# Patient Record
Sex: Male | Born: 1970 | Race: White | Hispanic: No | Marital: Married | Smoking: Current every day smoker
Health system: Southern US, Community
[De-identification: ages and names within clinical notes are randomized; demographics above are authoritative.]

---

## 2009-12-01 ENCOUNTER — Emergency Department: Payer: Self-pay | Admitting: Emergency Medicine

## 2012-07-15 ENCOUNTER — Emergency Department (HOSPITAL_COMMUNITY): Payer: 59

## 2012-07-15 ENCOUNTER — Encounter (HOSPITAL_COMMUNITY): Payer: Self-pay

## 2012-07-15 ENCOUNTER — Emergency Department (HOSPITAL_COMMUNITY)
Admission: EM | Admit: 2012-07-15 | Discharge: 2012-07-15 | Disposition: A | Discharge status: Home / Self Care | Payer: 59 | Attending: Emergency Medicine | Admitting: Emergency Medicine

## 2012-07-15 DIAGNOSIS — R0602 Shortness of breath: Secondary | ICD-10-CM

## 2012-07-15 DIAGNOSIS — R079 Chest pain, unspecified: Secondary | ICD-10-CM | POA: Insufficient documentation

## 2012-07-15 DIAGNOSIS — F419 Anxiety disorder, unspecified: Secondary | ICD-10-CM

## 2012-07-15 DIAGNOSIS — R110 Nausea: Secondary | ICD-10-CM | POA: Insufficient documentation

## 2012-07-15 DIAGNOSIS — R51 Headache: Secondary | ICD-10-CM | POA: Insufficient documentation

## 2012-07-15 DIAGNOSIS — F411 Generalized anxiety disorder: Secondary | ICD-10-CM | POA: Insufficient documentation

## 2012-07-15 LAB — CBC WITH DIFFERENTIAL/PLATELET
Basophils Absolute: 0 10*3/uL (ref 0.0–0.1)
Basophils Relative: 0 % (ref 0–1)
Eosinophils Relative: 1 % (ref 0–5)
HCT: 41.8 % (ref 39.0–52.0)
MCHC: 35.4 g/dL (ref 30.0–36.0)
MCV: 89.9 fL (ref 78.0–100.0)
Monocytes Absolute: 0.7 10*3/uL (ref 0.1–1.0)
Neutro Abs: 5.6 10*3/uL (ref 1.7–7.7)
RDW: 12.5 % (ref 11.5–15.5)

## 2012-07-15 LAB — BASIC METABOLIC PANEL
Calcium: 9.2 mg/dL (ref 8.4–10.5)
Creatinine, Ser: 0.68 mg/dL (ref 0.50–1.35)
GFR calc Af Amer: >90 mL/min (ref >90)
GFR calc non Af Amer: >90 mL/min (ref >90)

## 2012-07-15 LAB — TROPONIN I: Troponin I: <0.3 ng/mL (ref <0.30)

## 2012-07-15 MED ORDER — ASPIRIN 81 MG PO CHEW
CHEWABLE_TABLET | ORAL | Status: AC
  Administered 2012-07-15: 324 mg
  Filled 2012-07-15: qty 4

## 2012-07-15 MED ORDER — SODIUM CHLORIDE 0.9 % IV SOLN: INTRAVENOUS | Status: DC

## 2012-07-15 MED ORDER — NITROGLYCERIN 0.4 MG SL SUBL
0.4000 mg | SUBLINGUAL_TABLET | SUBLINGUAL | Status: DC | PRN
  Administered 2012-07-15 (×2): 0.4 mg via SUBLINGUAL
  Filled 2012-07-15: qty 25

## 2012-07-15 MED ORDER — ONDANSETRON HCL 4 MG/2ML IJ SOLN
4.0000 mg | Freq: Once | INTRAMUSCULAR | Status: AC
  Administered 2012-07-15: 4 mg via INTRAVENOUS
  Filled 2012-07-15: qty 2

## 2012-07-15 MED ORDER — LORAZEPAM 2 MG/ML IJ SOLN
1.0000 mg | Freq: Once | INTRAMUSCULAR | Status: AC
  Administered 2012-07-15: 09:00:00 via INTRAVENOUS
  Filled 2012-07-15: qty 1

## 2012-07-15 MED ORDER — SODIUM CHLORIDE 0.9 % IV BOLUS (SEPSIS)
250.0000 mL | Freq: Once | INTRAVENOUS | Status: AC
  Administered 2012-07-15: 999 mL via INTRAVENOUS

## 2012-07-15 MED ORDER — ASPIRIN 81 MG PO CHEW
324.0000 mg | CHEWABLE_TABLET | Freq: Once | ORAL | Status: AC
  Administered 2012-07-15: 324 mg via ORAL

## 2012-07-15 NOTE — ED Notes (Signed)
Pt rates pain/sensation 5/10 before ntg.

## 2012-07-15 NOTE — ED Notes (Signed)
Pt reports has worked a lot recently and felt bad over the weekend.  Says has been just aching all over.  Reports woke up this morning with "knot" in chest and difficulty breathing.  Denies any cold symptoms.

## 2012-07-15 NOTE — ED Notes (Signed)
Pt awaiting lab results. Denies any complaints at present.

## 2012-07-15 NOTE — ED Provider Notes (Signed)
History   This chart was scribed for Shelda Jakes, MD by Gerlean Ren. This patient was seen in room [facility]18/[facility]18 and the patient's care was started at 8:25AM.   CSN: [id number]  Arrival date & time 07/15/12  0746   First MD Initiated Contact with Patient 07/15/12 0804      Chief Complaint  Patient presents with  . Chest Pain    (Consider location/radiation/quality/duration/timing/severity/associated sxs/prior treatment) The history is provided by the patient. No language interpreter was used.   Alex Nolan is a 41 y.o. male who presents to the Emergency Department complaining of sudden onset, sharp substernal chest pain radiating to mid-back beginning 3.5 hours PTA with associated episodes of SOB.  Pt denies that deep breathing or movement worsens pain.  Pt reports myalgias and chronic abdominal pains, neither of which are currently worse than normal.  Pt reports sharp HA onset coinciding with chest pain.  Pt has not taken any OCM for pain.  Pt is a former smoker and reports occasional alcohol use.    History reviewed. No pertinent past medical history.  History reviewed. No pertinent past surgical history.  No family history on file.  History  Substance Use Topics  . Smoking status: Former Games developer  . Smokeless tobacco: Not on file  . Alcohol Use: Yes     occ      Review of Systems  Constitutional: Negative for fever and diaphoresis.  HENT: Negative for sore throat, rhinorrhea and neck pain.   Eyes: Negative for visual disturbance.  Respiratory: Positive for shortness of breath. Negative for cough.   Cardiovascular: Positive for chest pain.  Gastrointestinal: Positive for nausea. Negative for vomiting and diarrhea.  Genitourinary: Negative for dysuria.  Musculoskeletal: Positive for myalgias. Negative for back pain and joint swelling.  Skin: Negative for rash.  Neurological: Positive for headaches.    Allergies  Review of patient's allergies indicates no known  allergies.  Home Medications   Current Outpatient Rx  Name Route Sig Dispense Refill  . ADULT MULTIVITAMIN W/MINERALS CH Oral Take 1 tablet by mouth daily.    Marland Kitchen NAPROXEN SODIUM 220 MG PO TABS Oral Take 220 mg by mouth 2 (two) times daily as needed. Pain      BP 133/85  Pulse 99  Temp 97.7 F (36.5 C) (Oral)  Resp 20  Ht 6' (1.829 m)  Wt 225 lb (102.059 kg)  BMI 30.52 kg/m2  SpO2 99%  Physical Exam  Nursing note and vitals reviewed. Constitutional: He is oriented to person, place, and time. He appears well-developed and well-nourished.  HENT:  Head: Normocephalic and atraumatic.  Mouth/Throat: Oropharynx is clear and moist.  Eyes: Conjunctivae normal and EOM are normal. Pupils are equal, round, and reactive to light.  Neck: Normal range of motion. Neck supple. No tracheal deviation present.  Cardiovascular: Normal rate, regular rhythm and normal heart sounds.   No murmur heard. Pulmonary/Chest: Effort normal and breath sounds normal. He has no wheezes.  Abdominal: Soft. Bowel sounds are normal. There is no tenderness.  Musculoskeletal: Normal range of motion. He exhibits no edema.  Neurological: He is alert and oriented to person, place, and time. No cranial nerve deficit. Coordination normal.  Skin: Skin is warm and dry. He is not diaphoretic.  Psychiatric: He has a normal mood and affect.    ED Course  Procedures (including critical care time) DIAGNOSTIC STUDIES: Oxygen Saturation is 99% on room air, normal by my interpretation.    COORDINATION OF CARE: 8:37AM-  Ordered nitroglycerine and aspirin.  Informed pt he would need to stay for several hours of observation.   Labs Reviewed  BASIC METABOLIC PANEL - Abnormal; Notable for the following:    Potassium 3.2 (*)     Glucose, Bld 124 (*)     All other components within normal limits  CBC WITH DIFFERENTIAL  TROPONIN I  D-DIMER, QUANTITATIVE  TROPONIN I   Dg Chest Portable 1 View  07/15/2012  *RADIOLOGY REPORT*   Clinical Data: Chest pain, history of smoking  PORTABLE CHEST - 1 VIEW  Comparison: None.  Findings: No active infiltrate or effusion is seen.  Probable mild linear atelectasis or scarring is noted at the right lung base. The heart is within normal limits in size.  No bony abnormality is seen.  IMPRESSION: No active lung disease.  Mild right basilar linear atelectasis or scarring.   Original Report Authenticated By: Juline Patch, M.D.     Date: 07/15/2012  Rate: 98  Rhythm: normal sinus rhythm and sinus arrhythmia  QRS Axis: normal  Intervals: normal  ST/T Wave abnormalities: normal  Conduction Disutrbances:none  Narrative Interpretation:   Old EKG Reviewed: none available  Results for orders placed during the hospital encounter of 07/15/12  CBC WITH DIFFERENTIAL      Component Value Range   WBC 8.9  4.0 - 10.5 K/uL   RBC 4.65  4.22 - 5.81 MIL/uL   Hemoglobin 14.8  13.0 - 17.0 g/dL   HCT 09.8  11.9 - 14.7 %   MCV 89.9  78.0 - 100.0 fL   MCH 31.8  26.0 - 34.0 pg   MCHC 35.4  30.0 - 36.0 g/dL   RDW 82.9  56.2 - 13.0 %   Platelets 300  150 - 400 K/uL   Neutrophils Relative 63  43 - 77 %   Neutro Abs 5.6  1.7 - 7.7 K/uL   Lymphocytes Relative 28  12 - 46 %   Lymphs Abs 2.5  0.7 - 4.0 K/uL   Monocytes Relative 8  3 - 12 %   Monocytes Absolute 0.7  0.1 - 1.0 K/uL   Eosinophils Relative 1  0 - 5 %   Eosinophils Absolute 0.1  0.0 - 0.7 K/uL   Basophils Relative 0  0 - 1 %   Basophils Absolute 0.0  0.0 - 0.1 K/uL  BASIC METABOLIC PANEL      Component Value Range   Sodium 140  135 - 145 mEq/L   Potassium 3.2 (*) 3.5 - 5.1 mEq/L   Chloride 105  96 - 112 mEq/L   CO2 24  19 - 32 mEq/L   Glucose, Bld 124 (*) 70 - 99 mg/dL   BUN 7  6 - 23 mg/dL   Creatinine, Ser 8.65  0.50 - 1.35 mg/dL   Calcium 9.2  8.4 - 78.4 mg/dL   GFR calc non Af Amer >90  >90 mL/min   GFR calc Af Amer >90  >90 mL/min  TROPONIN I      Component Value Range   Troponin I <0.30  <0.30 ng/mL  D-DIMER, QUANTITATIVE       Component Value Range   D-Dimer, Quant 0.28  0.00 - 0.48 ug/mL-FEU  TROPONIN I      Component Value Range   Troponin I <0.30  <0.30 ng/mL      1. Chest pain   2. Shortness of breath   3. Anxiety       MDM  Workup for  chest pain shortness of breath without any significant findings. Troponin markers x2 -1 including 6 hours after the pain. Pain relief here in the emergency department with aspirin and nitroglycerin. Has not reoccurred. D-dimer was negative knots consistent with pulmonary and wasn't chest x-rays negative. Patient will be referred to cardiology followup and we recommended to start taking a baby aspirin a day and return for any newer worse symptoms.   I personally performed the services described in this documentation, which was scribed in my presence. The recorded information has been reviewed and considered.           Shelda Jakes, MD 07/15/12 [phone]

## 2012-07-15 NOTE — ED Notes (Signed)
Pt reports that pain is now completely gone after 2 ntg. md notified.

## 2012-07-15 NOTE — ED Notes (Signed)
Pt stated he has not feel well all weekend, today was up getting ready for work and felt worse than ever. midsternal cp, sob at times, denies any n/v/d. Denies any cough or fever. Former smoker.  Pt placed on all monitors.

## 2012-07-15 NOTE — ED Notes (Signed)
Pt cont. To be pain free, dozing in bed, resp even and unlabored, rise and fall of chest noted.

## 2012-08-02 ENCOUNTER — Encounter (HOSPITAL_COMMUNITY): Payer: Self-pay | Admitting: *Deleted

## 2012-08-02 ENCOUNTER — Emergency Department (HOSPITAL_COMMUNITY)
Admission: EM | Admit: 2012-08-02 | Discharge: 2012-08-02 | Disposition: A | Discharge status: Home / Self Care | Payer: 59 | Attending: Emergency Medicine | Admitting: Emergency Medicine

## 2012-08-02 DIAGNOSIS — J029 Acute pharyngitis, unspecified: Secondary | ICD-10-CM

## 2012-08-02 DIAGNOSIS — Z87891 Personal history of nicotine dependence: Secondary | ICD-10-CM | POA: Insufficient documentation

## 2012-08-02 MED ORDER — PENICILLIN G BENZATHINE 1200000 UNIT/2ML IM SUSP
1.2000 10*6.[IU] | Freq: Once | INTRAMUSCULAR | Status: AC
  Administered 2012-08-02: 1.2 10*6.[IU] via INTRAMUSCULAR
  Filled 2012-08-02 (×2): qty 2

## 2012-08-02 MED ORDER — IBUPROFEN 800 MG PO TABS
800.0000 mg | ORAL_TABLET | Freq: Once | ORAL | Status: AC
  Administered 2012-08-02: 800 mg via ORAL
  Filled 2012-08-02: qty 1

## 2012-08-02 NOTE — ED Notes (Signed)
Pt c/o sore throat and states he has hx of strep throat. Pt states this feels same as past strep.

## 2012-08-02 NOTE — ED Notes (Signed)
Pt discharged. Pt stable at time of discharge. Medications reviewed pt has no questions regarding discharge at this time. Pt voiced understanding of discharge instructions.  

## 2012-08-02 NOTE — ED Provider Notes (Signed)
History     CSN: [id number]  Arrival date & time 08/02/12  2259   First MD Initiated Contact with Patient 08/02/12 2319      Chief Complaint  Patient presents with  . Sore Throat    (Consider location/radiation/quality/duration/timing/severity/associated sxs/prior treatment) HPI Comments: T max 102.  No PCP.  Patient is a 41 y.o. male presenting with pharyngitis. The history is provided by the patient. No language interpreter was used.  Sore Throat This is a new problem. Episode onset: 5 days ago. The problem occurs constantly. The problem has been unchanged. Associated symptoms include a fever, headaches, myalgias, a sore throat and swollen glands. Pertinent negatives include no chills, coughing, nausea, rash or vomiting. The symptoms are aggravated by swallowing. Treatments tried: salt water gargles. The treatment provided mild relief.    History reviewed. No pertinent past medical history.  History reviewed. No pertinent past surgical history.  History reviewed. No pertinent family history.  History  Substance Use Topics  . Smoking status: Former Games developer  . Smokeless tobacco: Not on file  . Alcohol Use: Yes     occ      Review of Systems  Constitutional: Positive for fever. Negative for chills.  HENT: Positive for sore throat. Negative for trouble swallowing.   Respiratory: Negative for cough and wheezing.   Gastrointestinal: Negative for nausea and vomiting.  Musculoskeletal: Positive for myalgias.  Skin: Negative for rash.  Neurological: Positive for headaches.  All other systems reviewed and are negative.    Allergies  Review of patient's allergies indicates no known allergies.  Home Medications   Current Outpatient Rx  Name Route Sig Dispense Refill  . ADULT MULTIVITAMIN W/MINERALS CH Oral Take 1 tablet by mouth daily.    Marland Kitchen NAPROXEN SODIUM 220 MG PO TABS Oral Take 220 mg by mouth 2 (two) times daily as needed. Pain      BP 112/83  Pulse 95  Temp  98.1 F (36.7 C) (Oral)  Resp 18  Ht 6' (1.829 m)  Wt 230 lb (104.327 kg)  BMI 31.19 kg/m2  SpO2 96%  Physical Exam  Nursing note and vitals reviewed. Constitutional: He is oriented to person, place, and time. He appears well-developed and well-nourished. No distress.  HENT:  Head: Normocephalic and atraumatic. No trismus in the jaw.  Right Ear: External ear normal.  Left Ear: External ear normal.  Mouth/Throat: Uvula is midline. No dental abscesses or uvula swelling. Oropharyngeal exudate and posterior oropharyngeal erythema present. No posterior oropharyngeal edema.  Eyes: EOM are normal.  Neck: Normal range of motion.  Cardiovascular: Normal rate, regular rhythm, normal heart sounds and intact distal pulses.   Pulmonary/Chest: Breath sounds normal. Accessory muscle usage present. No stridor. Not tachypneic. No respiratory distress. He has no decreased breath sounds. He has no wheezes. He has no rhonchi. He has no rales. He exhibits no tenderness.  Abdominal: Soft. He exhibits no distension. There is no tenderness.  Musculoskeletal: Normal range of motion.  Lymphadenopathy:    He has cervical adenopathy.       Right cervical: Superficial cervical and deep cervical adenopathy present.       Left cervical: Superficial cervical and deep cervical adenopathy present.  Neurological: He is alert and oriented to person, place, and time.  Skin: Skin is warm and dry. He is not diaphoretic.  Psychiatric: He has a normal mood and affect. Judgment normal.    ED Course  Procedures (including critical care time)   Labs Reviewed  RAPID  STREP SCREEN   No results found.   1. Pharyngitis       MDM  Treated empirically for strep.   Continue salt water gargles Ibuprofen 800 mg TID      sor  Evalina Field, [place] 08/02/12 2337

## 2012-08-03 NOTE — ED Provider Notes (Signed)
Medical screening examination/treatment/procedure(s) were performed by non-physician practitioner and as supervising physician I was immediately available for consultation/collaboration.  Donnetta Hutching, MD 08/03/12 0030

## 2013-11-09 ENCOUNTER — Emergency Department (HOSPITAL_COMMUNITY)
Admission: EM | Admit: 2013-11-09 | Discharge: 2013-11-09 | Disposition: A | Discharge status: Home / Self Care | Payer: 59 | Attending: Emergency Medicine | Admitting: Emergency Medicine

## 2013-11-09 ENCOUNTER — Emergency Department (HOSPITAL_COMMUNITY): Payer: 59

## 2013-11-09 ENCOUNTER — Encounter (HOSPITAL_COMMUNITY): Payer: Self-pay | Admitting: Emergency Medicine

## 2013-11-09 DIAGNOSIS — S62319A Displaced fracture of base of unspecified metacarpal bone, initial encounter for closed fracture: Secondary | ICD-10-CM | POA: Insufficient documentation

## 2013-11-09 DIAGNOSIS — Y939 Activity, unspecified: Secondary | ICD-10-CM | POA: Insufficient documentation

## 2013-11-09 DIAGNOSIS — S62308A Unspecified fracture of other metacarpal bone, initial encounter for closed fracture: Secondary | ICD-10-CM

## 2013-11-09 DIAGNOSIS — Y929 Unspecified place or not applicable: Secondary | ICD-10-CM | POA: Insufficient documentation

## 2013-11-09 DIAGNOSIS — Z87891 Personal history of nicotine dependence: Secondary | ICD-10-CM | POA: Insufficient documentation

## 2013-11-09 DIAGNOSIS — W010XXA Fall on same level from slipping, tripping and stumbling without subsequent striking against object, initial encounter: Secondary | ICD-10-CM | POA: Insufficient documentation

## 2013-11-09 MED ORDER — HYDROCODONE-ACETAMINOPHEN 5-325 MG PO TABS: 1.0000 | ORAL_TABLET | ORAL | Status: AC | PRN

## 2013-11-09 MED ORDER — HYDROCODONE-ACETAMINOPHEN 5-325 MG PO TABS
2.0000 | ORAL_TABLET | Freq: Once | ORAL | Status: AC
  Administered 2013-11-09: 2 via ORAL
  Filled 2013-11-09: qty 2

## 2013-11-09 NOTE — ED Provider Notes (Signed)
CSN: [id number]     Arrival date & time 11/09/13  1807 History   First MD Initiated Contact with Patient 11/09/13 1833     Chief Complaint  Patient presents with  . Hand Pain   (Consider location/radiation/quality/duration/timing/severity/associated sxs/prior Treatment) HPI  Patient presents to the ED for evaluation of his right hand pain. He tripped on liquid and slipped falling on his right hand. The incident happened around lunch time and it hurts severely and he does not want to move it. He reports it being swollen and bruised. Denies head injury or loc.  History reviewed. No pertinent past medical history. History reviewed. No pertinent past surgical history. No family history on file. History  Substance Use Topics  . Smoking status: Former Games developer  . Smokeless tobacco: Not on file  . Alcohol Use: Yes     Comment: occ    Review of Systems ROS  The patient denies anorexia, fever, weight loss,, vision loss, decreased hearing, hoarseness, chest pain, syncope, dyspnea on exertion, peripheral edema, balance deficits, hemoptysis, abdominal pain, melena, hematochezia, severe indigestion/heartburn, hematuria, incontinence, genital sores, muscle weakness, suspicious skin lesions, transient blindness, difficulty walking, depression, unusual weight change, abnormal bleeding, enlarged lymph nodes, angioedema, and breast masses.  Allergies  Review of patient's allergies indicates no known allergies.  Home Medications   Current Outpatient Rx  Name  Route  Sig  Dispense  Refill  . HYDROcodone-acetaminophen (NORCO/VICODIN) 5-325 MG per tablet   Oral   Take 1-2 tablets by mouth every 4 (four) hours as needed.   20 tablet   0   . Multiple Vitamin (MULTIVITAMIN WITH MINERALS) TABS   Oral   Take 1 tablet by mouth daily.         . naproxen sodium (ALEVE) 220 MG tablet   Oral   Take 220 mg by mouth 2 (two) times daily as needed. Pain          BP 131/92  Pulse 102  Temp(Src) 98.1  F (36.7 C) (Oral)  Resp 20  Ht 6' (1.829 m)  Wt 218 lb (98.884 kg)  BMI 29.56 kg/m2  SpO2 96% Physical Exam  Nursing note and vitals reviewed. Constitutional: He appears well-developed and well-nourished. No distress.  HENT:  Head: Normocephalic and atraumatic.  Eyes: Pupils are equal, round, and reactive to light.  Neck: Normal range of motion. Neck supple.  Cardiovascular: Normal rate and regular rhythm.   Pulmonary/Chest: Effort normal.  Abdominal: Soft.  Musculoskeletal:       Right hand: He exhibits decreased range of motion, tenderness, bony tenderness and swelling. He exhibits normal two-point discrimination, normal capillary refill, no deformity and no laceration. Normal sensation noted. Decreased strength noted.       Hands: Neurological: He is alert.  Skin: Skin is warm and dry.    ED Course  Procedures (including critical care time) Labs Review Labs Reviewed - No data to display Imaging Review Dg Wrist Complete Right  11/09/2013   CLINICAL DATA:  Larey Seat on a wet floor and injured the right hand and wrist.  EXAM: RIGHT WRIST - COMPLETE 3+ VIEW  COMPARISON:  None.  FINDINGS: No evidence of acute fracture or dislocation. Joint spaces well preserved. Well-preserved bone mineral density. No intrinsic osseous abnormalities.  IMPRESSION: Normal examination.   Electronically Signed   By: Hulan Saas M.D.   On: 11/09/2013 18:44   Dg Hand Complete Right  11/09/2013   CLINICAL DATA:  Larey Seat on a wet floor and injured the right  hand and wrist.  EXAM: RIGHT HAND - COMPLETE 3+ VIEW  COMPARISON:  None.  FINDINGS: Nondisplaced fracture involving the base of the 5th metacarpal. No other fractures involving the bones of the hand. Well preserved bone mineral density. Well preserved joint spaces.  IMPRESSION: Nondisplaced fracture involving the base of the 5th metacarpal.   Electronically Signed   By: Hulan Saashomas  Lawrence M.D.   On: 11/09/2013 18:46    EKG Interpretation   None        MDM   1. Closed fracture of 5th metacarpal, initial encounter    Discussed case with Dr. Hilda LiasKeeling. Will place patient in ulnar gutter splint as requested and have patient call physicians office int he morning to schedule f/u appointment.  Analgesic pain medication given RICE criteria advised, NSAIDs advised.  43 y.o.Alex Nolan evaluation in the Emergency Department is complete. It has been determined that no acute conditions requiring further emergency intervention are present at this time. The patient/guardian have been advised of the diagnosis and plan. We have discussed signs and symptoms that warrant return to the ED, such as changes or worsening in symptoms.  Vital signs are stable at discharge. Filed Vitals:   11/09/13 1816  BP: 131/92  Pulse: 102  Temp: 98.1 F (36.7 C)  Resp: 20    Patient/guardian has voiced understanding and agreed to follow-up with the PCP or specialist.    Dorthula Matasiffany G Ranald Alessio, PA-C 11/09/13 1928

## 2013-11-09 NOTE — ED Notes (Signed)
Pt c/o right hand pain that started after falling and hitting his hand on the floor, pt states that he slipped on something in the floor,

## 2013-11-09 NOTE — Discharge Instructions (Signed)
Cast or Splint Care Casts and splints support injured limbs and keep bones from moving while they heal. It is important to care for your cast or splint at home.  HOME CARE INSTRUCTIONS  Keep the cast or splint uncovered during the drying period. It can take 24 to 48 hours to dry if it is made of plaster. A fiberglass cast will dry in less than 1 hour.  Do not rest the cast on anything harder than a pillow for the first 24 hours.  Do not put weight on your injured limb or apply pressure to the cast until your health care provider gives you permission.  Keep the cast or splint dry. Wet casts or splints can lose their shape and may not support the limb as well. A wet cast that has lost its shape can also create harmful pressure on your skin when it dries. Also, wet skin can become infected.  Cover the cast or splint with a plastic bag when bathing or when out in the rain or snow. If the cast is on the trunk of the body, take sponge baths until the cast is removed.  If your cast does become wet, dry it with a towel or a blow dryer on the cool setting only.  Keep your cast or splint clean. Soiled casts may be wiped with a moistened cloth.  Do not place any hard or soft foreign objects under your cast or splint, such as cotton, toilet paper, lotion, or powder.  Do not try to scratch the skin under the cast with any object. The object could get stuck inside the cast. Also, scratching could lead to an infection. If itching is a problem, use a blow dryer on a cool setting to relieve discomfort.  Do not trim or cut your cast or remove padding from inside of it.  Exercise all joints next to the injury that are not immobilized by the cast or splint. For example, if you have a long leg cast, exercise the hip joint and toes. If you have an arm cast or splint, exercise the shoulder, elbow, thumb, and fingers.  Elevate your injured arm or leg on 1 or 2 pillows for the first 1 to 3 days to decrease  swelling and pain.It is best if you can comfortably elevate your cast so it is higher than your heart. SEEK MEDICAL CARE IF:   Your cast or splint cracks.  Your cast or splint is too tight or too loose.  You have unbearable itching inside the cast.  Your cast becomes wet or develops a soft spot or area.  You have a bad smell coming from inside your cast.  You get an object stuck under your cast.  Your skin around the cast becomes red or raw.  You have new pain or worsening pain after the cast has been applied. SEEK IMMEDIATE MEDICAL CARE IF:   You have fluid leaking through the cast.  You are unable to move your fingers or toes.  You have discolored (blue or white), cool, painful, or very swollen fingers or toes beyond the cast.  You have tingling or numbness around the injured area.  You have severe pain or pressure under the cast.  You have any difficulty with your breathing or have shortness of breath.  You have chest pain. Document Released: 10/13/2000 Document Revised: 08/06/2013 Document Reviewed: 04/24/2013 [facility] Patient Information 2014 [place], [place].  Boxer's Fracture You have a break (fracture) of the fifth metacarpal bone. This is  commonly called a boxer's fracture. This is the bone in the hand where the little finger attaches. The fracture is in the end of that bone, closest to the little finger. It is usually caused when you hit an object with a clenched fist. Often, the knuckle is pushed down by the impact. Sometimes, the fracture rotates out of position. A boxer's fracture will usually heal within 6 weeks, if it is treated properly and protected from re-injury. Surgery is sometimes needed. A cast, splint, or bulky hand dressing may be used to protect and immobilize a boxer's fracture. Do not remove this device or dressing until your caregiver approves. Keep your hand elevated, and apply ice packs for 15-20 minutes every 2 hours, for the first 2 days.  Elevation and ice help reduce swelling and relieve pain. See your caregiver, or an orthopedic specialist, for follow-up care within the next 10 days. This is to make sure your fracture is healing properly. Document Released: 10/16/2005 Document Revised: 01/08/2012 Document Reviewed: 04/05/2007 [facility]Exit[facility] Patient Information 2014 [place], [place].

## 2013-11-10 NOTE — ED Provider Notes (Signed)
Medical screening examination/treatment/procedure(s) were performed by non-physician practitioner and as supervising physician I was immediately available for consultation/collaboration.  EKG Interpretation   None         Deovion Batrez M Marjani Kobel, DO 11/10/13 1157 

## 2014-01-01 ENCOUNTER — Emergency Department: Payer: Self-pay | Admitting: Emergency Medicine

## 2014-01-01 LAB — DRUG SCREEN, URINE
Amphetamines, Ur Screen: NEGATIVE (ref ?–1000)
Barbiturates, Ur Screen: NEGATIVE (ref ?–200)
Benzodiazepine, Ur Scrn: POSITIVE (ref ?–200)
CANNABINOID 50 NG, UR ~~LOC~~: NEGATIVE (ref ?–50)
Cocaine Metabolite,Ur ~~LOC~~: NEGATIVE (ref ?–300)
MDMA (ECSTASY) UR SCREEN: NEGATIVE (ref ?–500)
METHADONE, UR SCREEN: NEGATIVE (ref ?–300)
Opiate, Ur Screen: POSITIVE (ref ?–300)
PHENCYCLIDINE (PCP) UR S: NEGATIVE (ref ?–25)
Tricyclic, Ur Screen: NEGATIVE (ref ?–1000)

## 2014-01-01 LAB — COMPREHENSIVE METABOLIC PANEL
ALK PHOS: 92 U/L
Albumin: 3.7 g/dL (ref 3.4–5.0)
Anion Gap: 6 — ABNORMAL LOW (ref 7–16)
BILIRUBIN TOTAL: 0.5 mg/dL (ref 0.2–1.0)
BUN: 9 mg/dL (ref 7–18)
CHLORIDE: 107 mmol/L (ref 98–107)
CO2: 26 mmol/L (ref 21–32)
Calcium, Total: 8.9 mg/dL (ref 8.5–10.1)
Creatinine: 0.74 mg/dL (ref 0.60–1.30)
EGFR (African American): >60
EGFR (Non-African Amer.): >60
Glucose: 113 mg/dL — ABNORMAL HIGH (ref 65–99)
OSMOLALITY: 277 (ref 275–301)
POTASSIUM: 3.6 mmol/L (ref 3.5–5.1)
SGOT(AST): 21 U/L (ref 15–37)
SGPT (ALT): 45 U/L (ref 12–78)
Sodium: 139 mmol/L (ref 136–145)
Total Protein: 7.6 g/dL (ref 6.4–8.2)

## 2014-01-01 LAB — CBC
HCT: 44.6 % (ref 40.0–52.0)
HGB: 15.2 g/dL (ref 13.0–18.0)
MCH: 31.6 pg (ref 26.0–34.0)
MCHC: 34.2 g/dL (ref 32.0–36.0)
MCV: 92 fL (ref 80–100)
Platelet: 343 10*3/uL (ref 150–440)
RBC: 4.83 10*6/uL (ref 4.40–5.90)
RDW: 13.3 % (ref 11.5–14.5)
WBC: 12.5 10*3/uL — ABNORMAL HIGH (ref 3.8–10.6)

## 2014-01-01 LAB — URINALYSIS, COMPLETE
BILIRUBIN, UR: NEGATIVE
BLOOD: NEGATIVE
Bacteria: NONE SEEN
Glucose,UR: NEGATIVE mg/dL (ref 0–75)
Ketone: NEGATIVE
Leukocyte Esterase: NEGATIVE
Nitrite: NEGATIVE
Ph: 8 (ref 4.5–8.0)
Protein: NEGATIVE
Specific Gravity: 1.013 (ref 1.003–1.030)
Squamous Epithelial: <1

## 2014-01-01 LAB — ETHANOL
Ethanol %: <0.003 % (ref 0.000–0.080)
Ethanol: <3 mg/dL

## 2014-01-01 LAB — SALICYLATE LEVEL: Salicylates, Serum: 3 mg/dL — ABNORMAL HIGH

## 2014-01-01 LAB — TSH: THYROID STIMULATING HORM: 1.26 u[IU]/mL

## 2014-01-01 LAB — ACETAMINOPHEN LEVEL: Acetaminophen: <2 ug/mL

## 2014-01-28 ENCOUNTER — Other Ambulatory Visit: Payer: Self-pay | Admitting: Internal Medicine

## 2015-02-20 NOTE — Consult Note (Signed)
PATIENT NAME:  Alex Nolan, Alex Nolan MR#:  [id number] DATE OF BIRTH:  1971-10-19  PSYCHIATRY CONSULTATION   DATE OF CONSULTATION:  01/01/2014  REFERRING PHYSICIAN:  Lurena Joiner L. Shaune Pollack, MD CONSULTING PHYSICIAN:  Ardeen Fillers. Ubah Radke, MD  REASON FOR CONSULTATION: Having anxiety for the past 2 weeks.   HISTORY OF PRESENT ILLNESS: The patient is a 44 year old separated white male who presented to the ED and is requesting help for his anxiety. He reported that he has been having anxiety for the past 2 weeks. He stated that he feels like he is having upset stomach and was throwing up. Also feels like there is pounding in his neck. He stated that he is having problems at his work and has been having things which are bottling up for so long. He states that he was tried on Zoloft and Paxil several years ago by his primary care physician, but he has never taken any medication for anxiety recently. He went to the pastor in the church, and they advised him to come to the Emergency Department for help. The patient currently denies feeling depressed. He denied having any mood symptoms. He denied having any suicidal ideations or plans.   The patient reported that he fell and broke his hand 6 weeks ago. He was prescribed Vicodin at that time, which he took initially regularly, but now he has not taken it on a regular basis. He stated that he still has pills left at home. He reported that his workload is high, and he cannot think straight. He stated that he has not been sleeping well. He has been sleeping for a few hours for the past 2 to 3 nights. He stated that he has never seen a psychiatrist. He reported that he needs some medication to help with sleep as well. He denied having any perceptual disturbances. He denied using any drugs or alcohol at this time.   PAST PSYCHIATRIC HISTORY: The patient reported that he has never been admitted to a psychiatric hospital and has never seen a psychiatrist. He denied any history of suicide  attempts in the past.   SUBSTANCE ABUSE HISTORY: The patient reported that he does not use any drugs or alcohol. He has never been admitted to a psychiatric hospital.   FAMILY HISTORY: The patient reported that his mother, aunt and grandmother have history of anxiety and depression. There is no history of suicide attempts in the family. He does not know what medications they have been taking.   CURRENT MEDICATIONS: He stated that he was prescribed Vicodin in the past, but he is not taking any medications at this time.   ALLERGIES: No known drug allergies.   PAST MEDICAL HISTORY: History of chronic abdominal pain as well as recent injury to his hand 6 weeks ago.   SOCIAL HISTORY: The patient currently lives by himself. He has 2 sons, ages 92 and 46, who live with him. He is separated. The children are also monitored by their stepmother. The patient works in Occupational [employer] and stated that he feels stressed out by his job. He denied any pending legal charges.   REVIEW OF SYSTEMS:  CONSTITUTIONAL: Denies any fever or chills. No weight changes.  EYES: No double or blurred vision.  ENT: No hearing loss.  RESPIRATORY: No shortness of breath or cough.  CARDIOVASCULAR: Denies any chest pain or orthopnea.  GASTROINTESTINAL: No abdominal pain, nausea, vomiting or diarrhea.  GENITOURINARY: No frequency or incontinence.  ENDOCRINE: No heat or cold intolerance.  LYMPHATIC: No anemia  or easy bruising.  INTEGUMENTARY: No acne or rash.  MUSCULOSKELETAL: History of previous injury to the hand.  NEUROLOGIC: No tingling or weakness.   VITAL SIGNS: Temperature 97.7, pulse 89, respirations 20, blood pressure 108/83.  LABORATORY DATA: Glucose 113, BUN 9, creatinine 0.74, sodium 139, potassium 3.6, chloride 107, bicarbonate 26, anion gap 6, osmolality 277, calcium 8.9. Blood alcohol level less than 3. Protein 7.6, albumin 3.7, bilirubin 0.5, alkaline phosphatase 92, AST 21, ALT 45. TSH 1.26. UDS positive for  benzodiazepines and opioids. WBC 12.5, RBC 4.83, hemoglobin 15.2, hematocrit 44.6, platelet count 343, MCV 92, RDW 13.3.   MENTAL STATUS EXAMINATION: The patient is a moderately built male who appeared his stated age. He was calm and cooperative. He maintained fair eye contact. His speech was low in tone and volume. Mood was somewhat anxious. Affect was congruent. Thought process was logical and goal-directed. Thought content was non-delusional. He denied having any auditory or visual hallucinations. He denied having any perceptual disturbances. Language was appropriate. Fund of knowledge seems normal. He denied having any thoughts to harm himself.   DIAGNOSTIC IMPRESSION:  AXIS I: Panic disorder.  AXIS II: None.  AXIS III: Previous injury to the hand.   TREATMENT PLAN:  1. I discussed with the patient at length about the medications, treatment risks, benefits and alternatives.  2. I will start him on Lexapro 10 mg p.o. daily for his anxiety symptoms.  3. I will also give him a prescription of trazodone 100 mg p.o. at bedtime p.r.n. for insomnia.  4. He will follow up with [facility] for continuity of care. I discussed with the patient about the same, and he demonstrated understanding. The patient does not have any suicidal ideations or plans at this time, and he can be discharged safely from the Emergency Department at this time. I advised him to call us back or contact if he notices worsening of his symptoms.   Thank you for allowing me to participate in the care of this patient.   ____________________________ Ardeen FillersUzma S. Garnetta BuddyFaheem, MD usf:lb D: 01/01/2014 11:50:21 ET T: 01/01/2014 12:22:41 ET JOB#: [id number]  cc: Ardeen FillersUzma S. Garnetta BuddyFaheem, MD, <Dictator> Rhunette CroftUZMA S Savoy [place] MD ELECTRONICALLY SIGNED 01/01/2014 13:37

## 2015-06-08 ENCOUNTER — Emergency Department: Payer: Self-pay

## 2015-06-08 ENCOUNTER — Emergency Department
Admission: EM | Admit: 2015-06-08 | Discharge: 2015-06-09 | Disposition: A | Discharge status: Home / Self Care | Payer: Self-pay | Attending: Emergency Medicine | Admitting: Emergency Medicine

## 2015-06-08 ENCOUNTER — Other Ambulatory Visit: Payer: Self-pay

## 2015-06-08 ENCOUNTER — Encounter: Payer: Self-pay | Admitting: Emergency Medicine

## 2015-06-08 DIAGNOSIS — W01198A Fall on same level from slipping, tripping and stumbling with subsequent striking against other object, initial encounter: Secondary | ICD-10-CM | POA: Insufficient documentation

## 2015-06-08 DIAGNOSIS — Y998 Other external cause status: Secondary | ICD-10-CM | POA: Insufficient documentation

## 2015-06-08 DIAGNOSIS — Z23 Encounter for immunization: Secondary | ICD-10-CM | POA: Insufficient documentation

## 2015-06-08 DIAGNOSIS — Z79899 Other long term (current) drug therapy: Secondary | ICD-10-CM | POA: Insufficient documentation

## 2015-06-08 DIAGNOSIS — Z720 Tobacco use: Secondary | ICD-10-CM | POA: Insufficient documentation

## 2015-06-08 DIAGNOSIS — S0990XA Unspecified injury of head, initial encounter: Secondary | ICD-10-CM

## 2015-06-08 DIAGNOSIS — Y9389 Activity, other specified: Secondary | ICD-10-CM | POA: Insufficient documentation

## 2015-06-08 DIAGNOSIS — R55 Syncope and collapse: Secondary | ICD-10-CM | POA: Insufficient documentation

## 2015-06-08 DIAGNOSIS — Y9289 Other specified places as the place of occurrence of the external cause: Secondary | ICD-10-CM | POA: Insufficient documentation

## 2015-06-08 DIAGNOSIS — S0003XA Contusion of scalp, initial encounter: Secondary | ICD-10-CM | POA: Insufficient documentation

## 2015-06-08 LAB — CBC
HEMATOCRIT: 41.9 % (ref 40.0–52.0)
HEMOGLOBIN: 14.3 g/dL (ref 13.0–18.0)
MCH: 31 pg (ref 26.0–34.0)
MCHC: 34.1 g/dL (ref 32.0–36.0)
MCV: 90.9 fL (ref 80.0–100.0)
PLATELETS: 333 10*3/uL (ref 150–440)
RBC: 4.62 MIL/uL (ref 4.40–5.90)
RDW: 13 % (ref 11.5–14.5)
WBC: 7.6 10*3/uL (ref 3.8–10.6)

## 2015-06-08 LAB — BASIC METABOLIC PANEL
ANION GAP: 7 (ref 5–15)
BUN: 14 mg/dL (ref 6–20)
CO2: 26 mmol/L (ref 22–32)
CREATININE: 0.78 mg/dL (ref 0.61–1.24)
Calcium: 8.9 mg/dL (ref 8.9–10.3)
Chloride: 107 mmol/L (ref 101–111)
GFR calc Af Amer: >60 mL/min (ref >60)
GFR calc non Af Amer: >60 mL/min (ref >60)
GLUCOSE: 103 mg/dL — AB (ref 65–99)
Potassium: 3.9 mmol/L (ref 3.5–5.1)
Sodium: 140 mmol/L (ref 135–145)

## 2015-06-08 MED ORDER — SODIUM CHLORIDE 0.9 % IV BOLUS (SEPSIS)
1000.0000 mL | Freq: Once | INTRAVENOUS | Status: AC
  Administered 2015-06-08: 1000 mL via INTRAVENOUS

## 2015-06-08 MED ORDER — TETANUS-DIPHTH-ACELL PERTUSSIS 5-2.5-18.5 LF-MCG/0.5 IM SUSP
0.5000 mL | Freq: Once | INTRAMUSCULAR | Status: AC
  Administered 2015-06-08: 0.5 mL via INTRAMUSCULAR
  Filled 2015-06-08: qty 0.5

## 2015-06-08 NOTE — ED Provider Notes (Signed)
[facility] Emergency Department Provider Note  ____________________________________________  Time seen: Approximately 9:59 PM  I have reviewed the triage vital signs and the nursing notes.   HISTORY  Chief Complaint Loss of Consciousness    HPI Alex Nolan is a 44 y.o. 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 He awoke on the ground, fell backwards and hit his head on concrete and noticed a slight amount of bleeding and swelling over the back of his head. There was a brief loss of consciousness, woke oriented on the ground. There was no seizure witnessed. He was seen a value by EMS, who placed him in a cervical collar and transferred ER.  Patient relates that he feels well right now except first tenderness over the back of his head. He also reports that he had a brief 30 seconds of tingling noted in his fingers, but this is completely resolved.  He does report that earlier today he felt a little tired and fatigued, but denies any other new concerns or symptoms.  The present time he feels all right. No neck pain. No chest pain, no cough, no trouble breathing. No abdominal pain. No nausea or vomiting.    History reviewed. No pertinent past medical history.  There are no active problems to display for this patient.   History reviewed. No pertinent past surgical history.  Current Outpatient Rx  Name  Route  Sig  Dispense  Refill  . HYDROcodone-acetaminophen (NORCO/VICODIN) 5-325 MG per tablet   Oral   Take 1-2 tablets by mouth every 4 (four) hours as needed.   20 tablet   0   . Multiple Vitamin (MULTIVITAMIN WITH MINERALS) TABS   Oral   Take 1 tablet by mouth daily.         . naproxen sodium (ALEVE) 220 MG tablet   Oral   Take 220 mg by mouth 2 (two) times daily as needed. Pain            Allergies Review of patient's allergies indicates no known allergies.  History reviewed. No pertinent family history.  Social History History  Substance Use Topics  . Smoking status: Current Every Day Smoker  . Smokeless tobacco: Never Used  . Alcohol Use: Yes     Comment: occ    Review of Systems Constitutional: No fever. Felt a little bit chilled this morning, but this resolved. Eyes: No visual changes. ENT: No sore throat. Cardiovascular: Denies chest pain. Respiratory: Denies shortness of breath. Gastrointestinal: No abdominal pain.  No nausea, no vomiting.  No diarrhea.  No constipation. Genitourinary: Negative for dysuria. Musculoskeletal: Negative for back pain. Skin: Negative for rash. Neurological: Negative for headaches, focal weakness or numbness. Tetanus greater than 5 years ago. 10-point ROS otherwise negative.  ____________________________________________   PHYSICAL EXAM:  VITAL SIGNS: ED Triage Vitals  Enc Vitals Group     BP 06/08/15 2132 125/92 mmHg     Pulse Rate 06/08/15 2132 81     Resp --      Temp 06/08/15 2132 97.5 F (36.4 C)     Temp Source 06/08/15 2132 Oral     SpO2 06/08/15 2132 97 %     Weight 06/08/15 2132 235 lb (106.595 kg)     Height 06/08/15 2132 6\' 2"  (1.88 m)  Head Cir --      Peak Flow --      Pain Score 06/08/15 2134 6     Pain Loc --      Pain Edu? --      Excl. in GC? --     Constitutional: Alert and oriented. Well appearing and in no acute distress. Eyes: Conjunctivae are normal. PERRL. EOMI. normal tympanic membranes. No evidence basilar skull fracture. Head: Atraumatic except for a moderate approximately 3-4 cm hematoma over the posterior occiput. There is a very slight amount of bleeding, no laceration. There is approximately a 1 cm abrasion over the posterior occiput, bleeding is controlled. No laceration. Nose: No congestion/rhinnorhea. Mouth/Throat: Mucous membranes are moist.  Oropharynx  non-erythematous. Neck: No stridor.   No tenderness to the cervical spine. Patient remained in c-collar. Cardiovascular: Normal rate, regular rhythm. Grossly normal heart sounds.  Good peripheral circulation. Respiratory: Normal respiratory effort.  No retractions. Lungs CTAB. Gastrointestinal: Soft and nontender. No distention. No abdominal bruits. No CVA tenderness. Musculoskeletal: No lower extremity tenderness nor edema.  No joint effusions. Neurologic:  Normal speech and language. No gross focal neurologic deficits are appreciated. Normal sensory motor exam of all extremities. No sensory loss or deficit. No facial droop. 5 out of 5 strength in all extremities. No pronator drift. Skin:  Skin is warm, dry and intact. No rash noted. Psychiatric: Mood and affect are normal. Speech and behavior are normal.  ____________________________________________   LABS (all labs ordered are listed, but only abnormal results are displayed)  Labs Reviewed  BASIC METABOLIC PANEL - Abnormal; Notable for the following:    Glucose, Bld 103 (*)    All other components within normal limits  CBC   ____________________________________________  EKG  ED ECG REPORT I, Aquilla Shambley, the attending physician, personally viewed and interpreted this ECG.  Date: 06/08/2015 EKG Time: 2155 Rate: 80 Rhythm: normal sinus rhythm QRS Axis: normal Intervals: normal, normal QTC. No delta wave. No Brugada. ST/T Wave abnormalities: normal Conduction Disutrbances: Slight hint of an incomplete right bundle-branch block Narrative Interpretation: No acute ischemic change. Normal sinus rhythm. Slight right ventricular conduction delay.  ____________________________________________  RADIOLOGY  CT Cervical Spine Wo Contrast (Final result) Result time: 06/08/15 22:30:55   Final result by Rad Results In Interface (06/08/15 22:30:55)   Narrative:   EXAM: CT HEAD WITHOUT CONTRAST  CT CERVICAL SPINE WITHOUT  CONTRAST  TECHNIQUE: Multidetector CT imaging of the head and cervical spine was performed following the standard protocol without intravenous contrast. Multiplanar CT image reconstructions of the cervical spine were also generated.  COMPARISON: None.  FINDINGS: CT HEAD FINDINGS  No skull fracture is noted. Paranasal sinuses and mastoid air cells are unremarkable. No intracranial hemorrhage, mass effect or midline shift. No acute cortical infarction. No mass lesion is noted on this unenhanced scan. The gray and white-matter differentiation is preserved.  CT CERVICAL SPINE FINDINGS  Axial images of the cervical spine shows no acute fracture or subluxation. Computer processed images shows no acute fracture or subluxation. Mild degenerative changes C1-C2 articulation. There is no pneumothorax in visualized lung apices. No prevertebral soft tissue swelling. Cervical airway is patent.  IMPRESSION: 1. No acute intracranial abnormality. No significant change. 2. No cervical spine acute fracture or subluxation. Mild degenerative changes.    ____________________________________________   PROCEDURES  Procedure(s) performed: None  Critical Care performed: No   Patient's abrasion cleansed with saline, and scrubbed. No foreign bodies. No laceration.  ____________________________________________   INITIAL IMPRESSION / ASSESSMENT  AND PLAN / ED COURSE  Pertinent labs & imaging results that were available during my care of the patient were reviewed by me and considered in my medical decision making (see chart for details).  Patient's syncopal episode while at work. Sounds as though he may have had a vagal type of episode causing him to have syncope. No witnessed seizure activity. Awake alert and oriented in the ER as well as immediately after falling. His obvious hematoma to the back of the skull, and because he had a brief episode of slight paresthesias in his fingers we will  also obtain CT imaging of the head and cervical spine. It is traumatic injury by exam other than posterior occiput.  EKG no acute abnormalities or ischemic changes. We'll obtain basic labs to evaluate for anemia or electrolyte abnormality. We will also hydrate him generously. And continue to watch him clinically in the ER. I most suspect he had a vasovagal episode with a fall, but we must rule out intracranial injury.  ----------------------------------------- 11:28 PM on 06/08/2015 -----------------------------------------  Patient awake and alert. Vital signs stable. He reports feeling improved. Patient's wound bandaged, we will discharge him to home. Advised close follow-up precautions and return instructions. Patient agreeable with plan. Appears much improved. ____________________________________________   FINAL CLINICAL IMPRESSION(S) / ED DIAGNOSES  Final diagnoses:  Vasovagal syncope  Closed head injury, initial encounter      Sharyn Creamer, MD 06/08/15 2329

## 2015-06-08 NOTE — Discharge Instructions (Signed)
You have been seen today in the Emergency Department (ED)  for syncope (passing out).  Your workup including labs and EKG show reassuring results.  Your symptoms may be due to dehydration, so it is important that you drink plenty of non-alcoholic fluids.  Please call your regular doctor as soon as possible to schedule the next available clinic appointment to follow up with him/her regarding your visit to the ED and your symptoms.  Return to the Emergency Department (ED)  if you have any further syncopal episodes (pass out again) or develop ANY chest pain, pressure, tightness, trouble breathing, sudden sweating, or other symptoms that concern you.     Vasovagal Syncope, Adult Syncope, commonly known as fainting, is a temporary loss of consciousness. It occurs when the blood flow to the brain is reduced. Vasovagal syncope (also called neurocardiogenic syncope) is a fainting spell in which the blood flow to the brain is reduced because of a sudden drop in heart rate and blood pressure. Vasovagal syncope occurs when the brain and the cardiovascular system (blood vessels) do not adequately communicate and respond to each other. This is the most common cause of fainting. It often occurs in response to fear or some other type of emotional or physical stress. The body has a reaction in which the heart starts beating too slowly or the blood vessels expand, reducing blood pressure. This type of fainting spell is generally considered harmless. However, injuries can occur if a person takes a sudden fall during a fainting spell.  CAUSES  Vasovagal syncope occurs when a person's blood pressure and heart rate decrease suddenly, usually in response to a trigger. Many things and situations can trigger an episode. Some of these include:   Pain.   Fear.   The sight of blood or medical procedures, such as blood being drawn from a vein.   Common activities, such as coughing, swallowing, stretching, or going to the  bathroom.   Emotional stress.   Prolonged standing, especially in a warm environment.   Lack of sleep or rest.   Prolonged lack of food.   Prolonged lack of fluids.   Recent illness.  The use of certain drugs that affect blood pressure, such as cocaine, alcohol, marijuana, inhalants, and opiates.  SYMPTOMS  Before the fainting episode, you may:   Feel dizzy or light headed.   Become pale.  Sense that you are going to faint.   Feel like the room is spinning.   Have tunnel vision, only seeing directly in front of you.   Feel sick to your stomach (nauseous).   See spots or slowly lose vision.   Hear ringing in your ears.   Have a headache.   Feel warm and sweaty.   Feel a sensation of pins and needles. During the fainting spell, you will generally be unconscious for no longer than a couple minutes before waking up and returning to normal. If you get up too quickly before your body can recover, you may faint again. Some twitching or jerky movements may occur during the fainting spell.  DIAGNOSIS  Your caregiver will ask about your symptoms, take a medical history, and perform a physical exam. Various tests may be done to rule out other causes of fainting. These may include blood tests and tests to check the heart, such as electrocardiography, echocardiography, and possibly an electrophysiology study. When other causes have been ruled out, a test may be done to check the body's response to changes in position (tilt table  test). TREATMENT  Most cases of vasovagal syncope do not require treatment. Your caregiver may recommend ways to avoid fainting triggers and may provide home strategies for preventing fainting. If you must be exposed to a possible trigger, you can drink additional fluids to help reduce your chances of having an episode of vasovagal syncope. If you have warning signs of an oncoming episode, you can respond by positioning yourself favorably (lying  down). If your fainting spells continue, you may be given medicines to prevent fainting. Some medicines may help make you more resistant to repeated episodes of vasovagal syncope. Special exercises or compression stockings may be recommended. In rare cases, the surgical placement of a pacemaker is considered. HOME CARE INSTRUCTIONS   Learn to identify the warning signs of vasovagal syncope.   Sit or lie down at the first warning sign of a fainting spell. If sitting, put your head down between your legs. If you lie down, swing your legs up in the air to increase blood flow to the brain.   Avoid hot tubs and saunas.  Avoid prolonged standing.  Drink enough fluids to keep your urine clear or pale yellow. Avoid caffeine.  Increase salt in your diet as directed by your caregiver.   If you have to stand for a long time, perform movements such as:   Crossing your legs.   Flexing and stretching your leg muscles.   Squatting.   Moving your legs.   Bending over.   Only take over-the-counter or prescription medicines as directed by your caregiver. Do not suddenly stop any medicines without asking your caregiver first. SEEK MEDICAL CARE IF:   Your fainting spells continue or happen more frequently in spite of treatment.   You lose consciousness for more than a couple minutes.  You have fainting spells during or after exercising or after being startled.   You have new symptoms that occur with the fainting spells, such as:   Shortness of breath.  Chest pain.   Irregular heartbeat.   You have episodes of twitching or jerky movements that last longer than a few seconds.  You have episodes of twitching or jerky movements without obvious fainting. SEEK IMMEDIATE MEDICAL CARE IF:   You have injuries or bleeding after a fainting spell.   You have episodes of twitching or jerky movements that last longer than 5 minutes.   You have more than one spell of twitching or  jerky movements before returning to consciousness after fainting. MAKE SURE YOU:   Understand these instructions.  Will watch your condition.  Will get help right away if you are not doing well or get worse. Document Released: 10/02/2012 Document Reviewed: 10/02/2012 [facility] Patient Information 2015 [place], [place]. This information is not intended to replace advice given to you by your health care provider. Make sure you discuss any questions you have with your health care provider. Head Injury You have a head injury. Headaches and throwing up (vomiting) are common after a head injury. It should be easy to wake up from sleeping. Sometimes you must stay in the hospital. Most problems happen within the first 24 hours. Side effects may occur up to 7-10 days after the injury.  WHAT ARE THE TYPES OF HEAD INJURIES? Head injuries can be as minor as a bump. Some head injuries can be more severe. More severe head injuries include:  A jarring injury to the brain (concussion).  A bruise of the brain (contusion). This mean there is bleeding in the brain that  can cause swelling.  A cracked skull (skull fracture).  Bleeding in the brain that collects, clots, and forms a bump (hematoma). WHEN SHOULD I GET HELP RIGHT AWAY?   You are confused or sleepy.  You cannot be woken up.  You feel sick to your stomach (nauseous) or keep throwing up (vomiting).  Your dizziness or unsteadiness is getting worse.  You have very bad, lasting headaches that are not helped by medicine. Take medicines only as told by your doctor.  You cannot use your arms or legs like normal.  You cannot walk.  You notice changes in the black spots in the center of the colored part of your eye (pupil).  You have clear or bloody fluid coming from your nose or ears.  You have trouble seeing. During the next 24 hours after the injury, you must stay with someone who can watch you. This person should get help right away (call  911 in the [place] if you start to shake and are not able to control it (have seizures), you pass out, or you are unable to wake up. HOW CAN I PREVENT A HEAD INJURY IN THE FUTURE?  Wear seat belts.  Wear a helmet while bike riding and playing sports like football.  Stay away from dangerous activities around the house. WHEN CAN I RETURN TO NORMAL ACTIVITIES AND ATHLETICS? See your doctor before doing these activities. You should not do normal activities or play contact sports until 1 week after the following symptoms have stopped:  Headache that does not go away.  Dizziness.  Poor attention.  Confusion.  Memory problems.  Sickness to your stomach or throwing up.  Tiredness.  Fussiness.  Bothered by bright lights or loud noises.  Anxiousness or depression.  Restless sleep. MAKE SURE YOU:   Understand these instructions.  Will watch your condition.  Will get help right away if you are not doing well or get worse. Document Released: 09/28/2008 Document Revised: 03/02/2014 Document Reviewed: 06/23/2013 [facility] Patient Information 2015 [place], [place]. This information is not intended to replace advice given to you by your health care provider. Make sure you discuss any questions you have with your health care provider.

## 2015-06-08 NOTE — ED Notes (Signed)
Reports generally not feeling well today.  C/O cold and chills and fatigue today.  Went to work and while working (describes being busy-- walking at a fast walk.)  Felt hot and the next thing he knew he was on the floor and soneone asking if he was ok.  Fell from standing position and hit a concrete floor.  Laceration to back of head, bleeding controlled.

## 2015-06-09 NOTE — ED Notes (Signed)
AAOx3.  Skin warm and dry. NAD.  Ambulates with easy and steady gait.   

## 2016-04-20 ENCOUNTER — Encounter: Payer: Self-pay | Admitting: Emergency Medicine

## 2016-04-20 ENCOUNTER — Emergency Department: Payer: Self-pay

## 2016-04-20 ENCOUNTER — Other Ambulatory Visit: Payer: Self-pay

## 2016-04-20 DIAGNOSIS — K8020 Calculus of gallbladder without cholecystitis without obstruction: Secondary | ICD-10-CM | POA: Insufficient documentation

## 2016-04-20 DIAGNOSIS — J40 Bronchitis, not specified as acute or chronic: Secondary | ICD-10-CM | POA: Insufficient documentation

## 2016-04-20 DIAGNOSIS — F17210 Nicotine dependence, cigarettes, uncomplicated: Secondary | ICD-10-CM | POA: Insufficient documentation

## 2016-04-20 LAB — CBC
HCT: 43.5 % (ref 40.0–52.0)
HEMOGLOBIN: 14.9 g/dL (ref 13.0–18.0)
MCH: 31.2 pg (ref 26.0–34.0)
MCHC: 34.4 g/dL (ref 32.0–36.0)
MCV: 90.7 fL (ref 80.0–100.0)
Platelets: 340 10*3/uL (ref 150–440)
RBC: 4.79 MIL/uL (ref 4.40–5.90)
RDW: 13 % (ref 11.5–14.5)
WBC: 9 10*3/uL (ref 3.8–10.6)

## 2016-04-20 NOTE — ED Notes (Addendum)
Patient ambulatory to triage with steady gait, without difficulty or distress noted; pt reports pressure to mid chest [facility]; st recent sinus congestion this week; +smoker; pt reports recent weight loss from 230lbs

## 2016-04-21 ENCOUNTER — Emergency Department: Payer: Self-pay

## 2016-04-21 ENCOUNTER — Emergency Department
Admission: EM | Admit: 2016-04-21 | Discharge: 2016-04-21 | Disposition: A | Discharge status: Home / Self Care | Payer: Self-pay | Attending: Emergency Medicine | Admitting: Emergency Medicine

## 2016-04-21 DIAGNOSIS — J40 Bronchitis, not specified as acute or chronic: Secondary | ICD-10-CM

## 2016-04-21 DIAGNOSIS — K8020 Calculus of gallbladder without cholecystitis without obstruction: Secondary | ICD-10-CM

## 2016-04-21 LAB — BASIC METABOLIC PANEL
Anion gap: 6 (ref 5–15)
BUN: 12 mg/dL (ref 6–20)
CALCIUM: 8.9 mg/dL (ref 8.9–10.3)
CO2: 25 mmol/L (ref 22–32)
CREATININE: 0.68 mg/dL (ref 0.61–1.24)
Chloride: 108 mmol/L (ref 101–111)
GFR calc Af Amer: >60 mL/min (ref >60)
Glucose, Bld: 122 mg/dL — ABNORMAL HIGH (ref 65–99)
Potassium: 3.7 mmol/L (ref 3.5–5.1)
Sodium: 139 mmol/L (ref 135–145)

## 2016-04-21 LAB — TROPONIN I

## 2016-04-21 MED ORDER — AZITHROMYCIN 500 MG PO TABS
500.0000 mg | ORAL_TABLET | Freq: Once | ORAL | Status: AC
  Administered 2016-04-21: 500 mg via ORAL
  Filled 2016-04-21: qty 1

## 2016-04-21 MED ORDER — AZITHROMYCIN 500 MG PO TABS: 500.0000 mg | ORAL_TABLET | Freq: Every day | ORAL | Status: AC

## 2016-04-21 MED ORDER — IOPAMIDOL (ISOVUE-300) INJECTION 61%
100.0000 mL | Freq: Once | INTRAVENOUS | Status: AC | PRN
  Administered 2016-04-21: 100 mL via INTRAVENOUS

## 2016-04-21 NOTE — ED Notes (Signed)
Pt changed into gown and resting comfortably at this time.

## 2016-04-21 NOTE — ED Provider Notes (Signed)
[facility] Provider [facility]  ____________________________________________  Time seen: 1:30 AM  I have reviewed the triage vital signs and the nursing notes.   HISTORY  Chief Complaint Chest Pain      HPI Alex Nolan is a 45 y.o. male with history of 2 pack a day cigarette use presents with sinus congestion 1 week with productive cough noted today with pink tinged sputum. Patient admits to 20 pound weight loss over the course of one month without actively trying to do so.    Past medical history Anxiety There are no active problems to display for this patient.   Past surgical history   none  Current Outpatient Rx  Name  Route  Sig  Dispense  Refill  . HYDROcodone-acetaminophen (NORCO/VICODIN) 5-325 MG per tablet   Oral   Take 1-2 tablets by mouth every 4 (four) hours as needed.   20 tablet   0   . Multiple Vitamin (MULTIVITAMIN WITH MINERALS) TABS   Oral   Take 1 tablet by mouth daily.         . naproxen sodium (ALEVE) 220 MG tablet   Oral   Take 220 mg by mouth 2 (two) times daily as needed. Pain           Allergies  no known drug allergies No family history on file.  Social History Social History  Substance Use Topics  . Smoking status: Current Every Day Smoker -- 0.50 packs/day    Types: Cigarettes  . Smokeless tobacco: Never Used  . Alcohol Use: No    Review of Systems  Constitutional: Negative for fever. Eyes: Negative for visual changes. ENT: Negative for sore throat.Positive for sinus congestion Cardiovascular: Positive chest pressure  Respiratory: Negative for shortness of breath. positive for cough Gastrointestinal: Negative for abdominal pain, vomiting and diarrhea. Genitourinary: Negative for dysuria. Musculoskeletal: Negative for back pain. Skin: Negative for rash. Neurological: Negative for headaches, focal weakness or numbness. Psychiatric:Positive for anxiety 10-point ROS  otherwise negative.  ____________________________________________   PHYSICAL EXAM:  VITAL SIGNS: ED Triage Vitals  Enc Vitals Group     BP 04/20/16 2340 131/89 mmHg     Pulse Rate 04/20/16 2340 75     Resp 04/21/16 0131 13     Temp 04/20/16 2340 97.5 F (36.4 C)     Temp Source 04/20/16 2340 Oral     SpO2 04/20/16 2340 98 %     Weight 04/20/16 2340 201 lb 11.2 oz (91.491 kg)     Height 04/20/16 2340 6' (1.829 m)     Head Cir --      Peak Flow --      Pain Score 04/20/16 2338 5     Pain Loc --      Pain Edu? --      Excl. in GC? --      Constitutional: Alert and oriented. Well appearing and in no distress. Eyes: Conjunctivae are normal. PERRL. Normal extraocular movements. ENT   Head: Normocephalic and atraumatic.   Nose: No congestion/rhinnorhea.   Mouth/Throat: Mucous membranes are moist.   Neck: No stridor. Hematological/Lymphatic/Immunilogical: No cervical lymphadenopathy. Cardiovascular: Normal rate, regular rhythm. Normal and symmetric distal pulses are present in all extremities. No murmurs, rubs, or gallops. Respiratory: Normal respiratory effort without tachypnea nor retractions. Breath sounds are clear and equal bilaterally. No wheezes/rales/rhonchi. Gastrointestinal: Soft and nontender. No distention. There is no CVA tenderness. Genitourinary: deferred Musculoskeletal: Nontender with normal range of motion in all extremities.  No joint effusions.  No lower extremity tenderness nor edema. Neurologic:  Normal speech and language. No gross focal neurologic deficits are appreciated. Speech is normal.  Skin:  Skin is warm, dry and intact. No rash noted. Psychiatric: Mood and affect are normal. Speech and behavior are normal. Patient exhibits appropriate insight and judgment.  ____________________________________________    LABS (pertinent positives/negatives)  Labs Reviewed  BASIC METABOLIC PANEL - Abnormal; Notable for the following:    Glucose,  Bld 122 (*)    All other components within normal limits  CBC  TROPONIN I     ____________________________________________   EKG  ED ECG REPORT I, Fulton N BROWN, the attending physician, personally viewed and interpreted this ECG.   Date: 04/21/2016  EKG Time: 11:41 PM  Rate: 73  Rhythm: Normal sinus rhythm  Axis: Normal  Intervals: Normal  ST&T Change: None   ____________________________________________    RADIOLOGY  DG Chest 2 View (Final result) Result time: 04/21/16 00:19:30   Final result by Rad Results In Interface (04/21/16 00:19:30)   Narrative:   CLINICAL DATA: 45 year old male with chest pain  EXAM: CHEST 2 VIEW  COMPARISON: Chest radiograph dated 07/15/2012  FINDINGS: Two views of the chest demonstrate emphysematous changes of the lungs. No focal consolidation, pleural effusion, or pneumothorax. The cardiac silhouette is within normal limits with no acute osseous pathology.  IMPRESSION: No active cardiopulmonary disease.   Electronically Signed By: Elgie Collard M.D. On: 04/21/2016 00:19         CT Chest W Contrast (Final result) Result time: 04/21/16 03:08:53   Final result by Rad Results In Interface (04/21/16 03:08:53)   Narrative:   CLINICAL DATA: 45 year old male with chest pain  EXAM: CT CHEST WITH CONTRAST  TECHNIQUE: Multidetector CT imaging of the chest was performed during intravenous contrast administration.  CONTRAST: ISOVUE-300 IOPAMIDOL (ISOVUE-300) INJECTION 61%  COMPARISON: Chest radiograph dated 04/20/2016  FINDINGS: Minimal bibasilar dependent atelectatic changes of the lungs. The lungs are otherwise clear. Mild centrilobular emphysema. There is no pleural effusion or pneumothorax. The central airways are patent.  The thoracic aorta appears unremarkable. The origins of the great vessels of the aortic arch appear patent. The central pulmonary arteries appear unremarkable. There is  no cardiomegaly or pericardial effusion. No hilar or mediastinal adenopathy. The esophagus is grossly unremarkable. No thyroid nodules identified.  There is no axillary adenopathy. The chest wall soft tissues appear unremarkable. The osseous structures are intact.  There multiple stones within the visualized gallbladder. The visualized upper abdomen is otherwise unremarkable.  IMPRESSION: No acute intrathoracic pathology.  Cholelithiasis.   Electronically Signed By: Elgie Collard M.D. On: 04/21/2016 03:08          DG Chest 2 View (Final result) Result time: 04/21/16 00:19:30   Final result by Rad Results In Interface (04/21/16 00:19:30)   Narrative:   CLINICAL DATA: 45 year old male with chest pain  EXAM: CHEST 2 VIEW  COMPARISON: Chest radiograph dated 07/15/2012  FINDINGS: Two views of the chest demonstrate emphysematous changes of the lungs. No focal consolidation, pleural effusion, or pneumothorax. The cardiac silhouette is within normal limits with no acute osseous pathology.  IMPRESSION: No active cardiopulmonary disease.   Electronically Signed By: Elgie Collard M.D. On: 04/21/2016 00:19      INITIAL IMPRESSION / ASSESSMENT AND PLAN / ED COURSE  Pertinent labs & imaging results that were available during my care of the patient were reviewed by me and considered in my medical decision making (see chart for details).  Patient given azithromycin 500 mg will be prescribed the same at home. Patient advised to follow-up with primary care provider for further evaluation of weight loss.  ____________________________________________   FINAL CLINICAL IMPRESSION(S) / ED DIAGNOSES  Final diagnoses:  Bronchitis  Gallstones      Darci Currentandolph N Brown, MD 04/21/16 [phone]

## 2016-04-21 NOTE — Discharge Instructions (Signed)

## 2016-04-21 NOTE — ED Notes (Signed)
Pt discharged to home.  Discharge instructions reviewed.  Verbalized understanding.  No questions or concerns at this time.  Teach back verified.  Pt in NAD.  No items left in ED.   

## 2017-07-04 IMAGING — CT CT CHEST W/ CM
2 of 3 series · 17 of 46 positions shown, 19 images · IV contrast (iopamidol)
Comparison: Chest radiograph dated 04/20/2016

CLINICAL DATA: 44-year-old male with chest pain

EXAM:
CT CHEST WITH CONTRAST
TECHNIQUE: Multidetector CT imaging of the chest was performed during
intravenous contrast administration.
CONTRAST:  100mL [id number] IOPAMIDOL ([id number]) INJECTION 61%

[Series 2: routine chest with · axial · 0.71mm/px · z∈[-339,-64]mm · 14 of 65 slices shown, 16 images]
[im 5/65  soft-tissue]
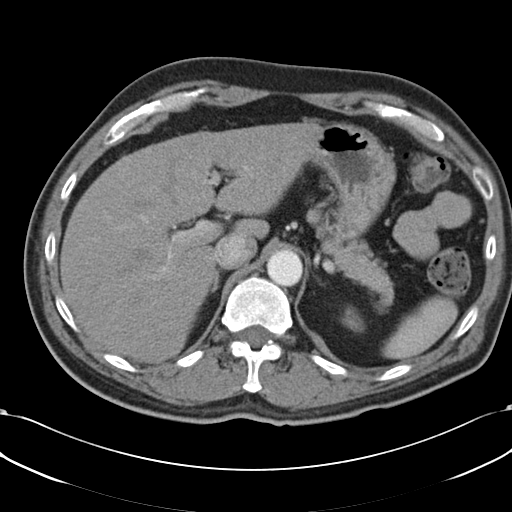
[im 5/65  bone]
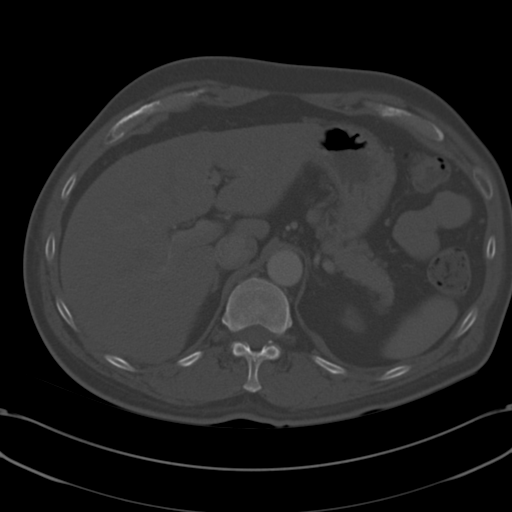
[im 9/65  soft-tissue]
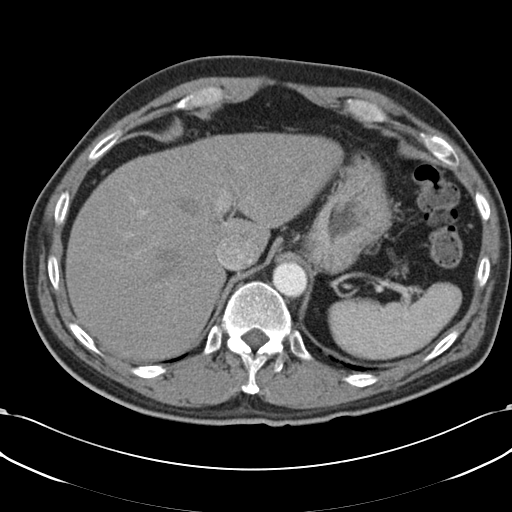
[im 13/65  soft-tissue]
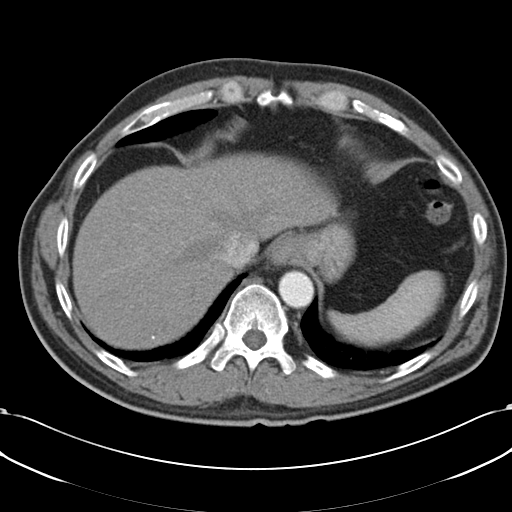
[im 17/65  soft-tissue]
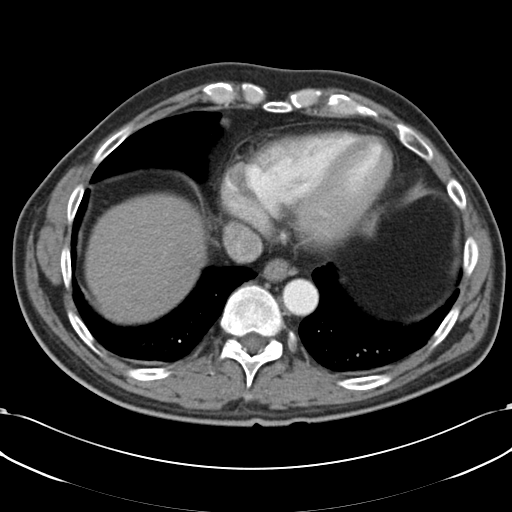
[im 21/65  soft-tissue]
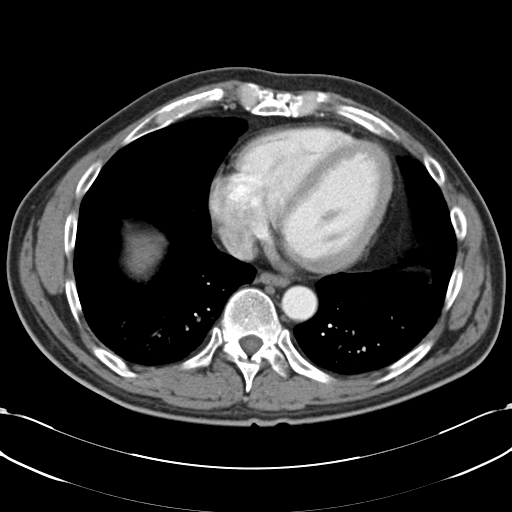
[im 25/65  soft-tissue]
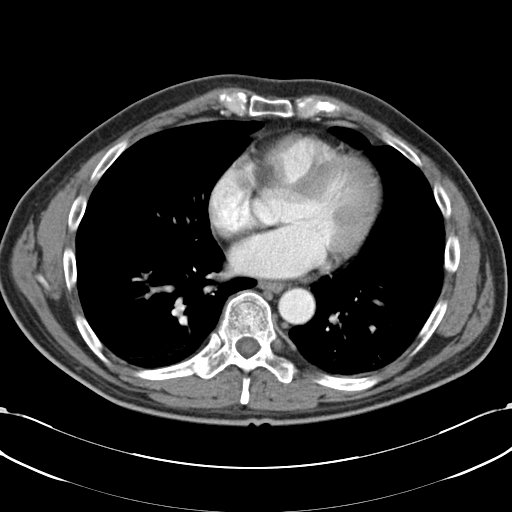
[im 29/65  soft-tissue]
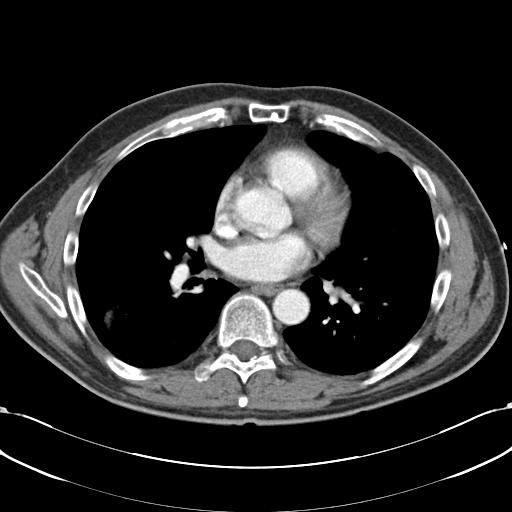
[im 36/65  soft-tissue]
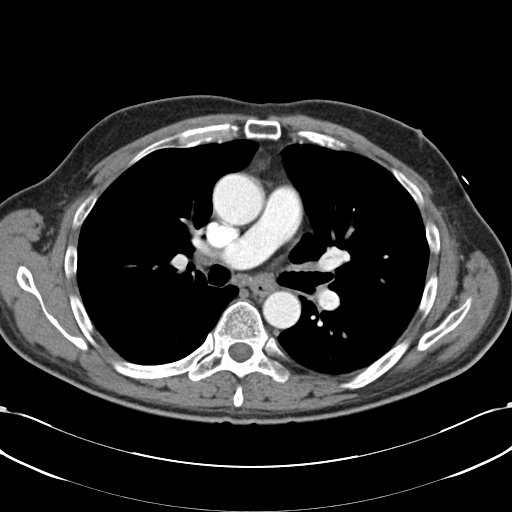
[im 40/65  soft-tissue]
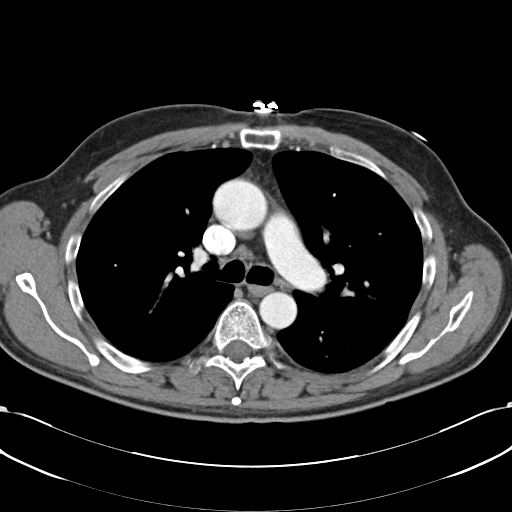
[im 40/65  bone]
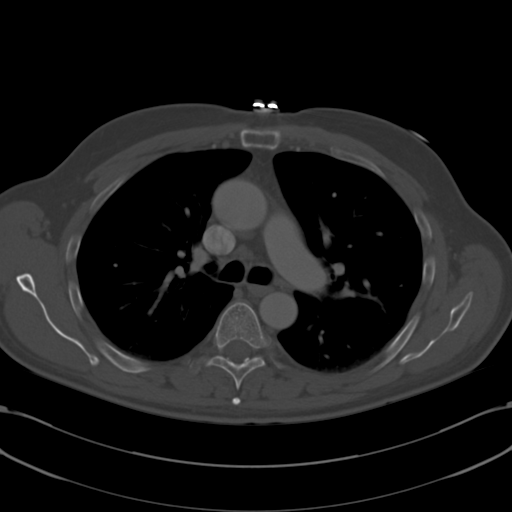
[im 44/65  soft-tissue]
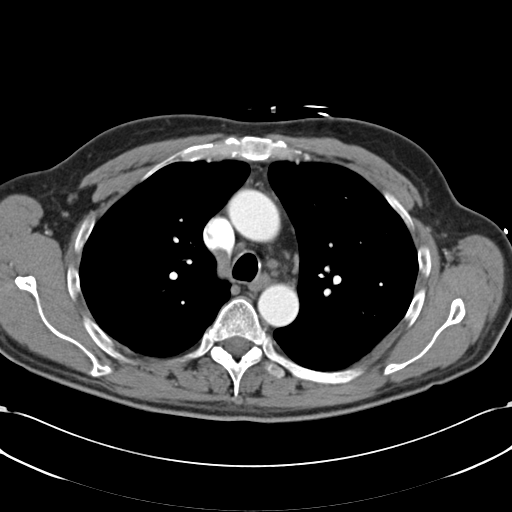
[im 48/65  soft-tissue]
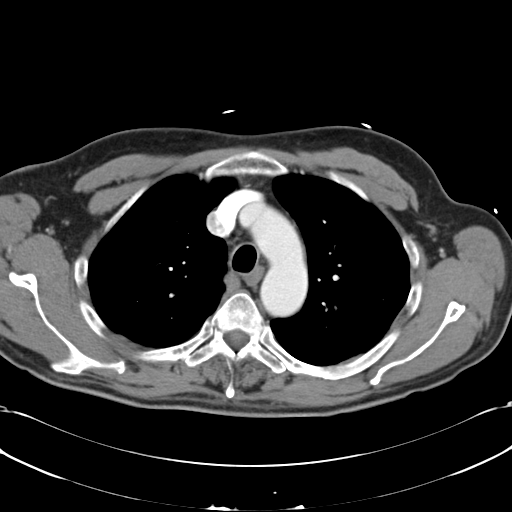
[im 52/65  soft-tissue]
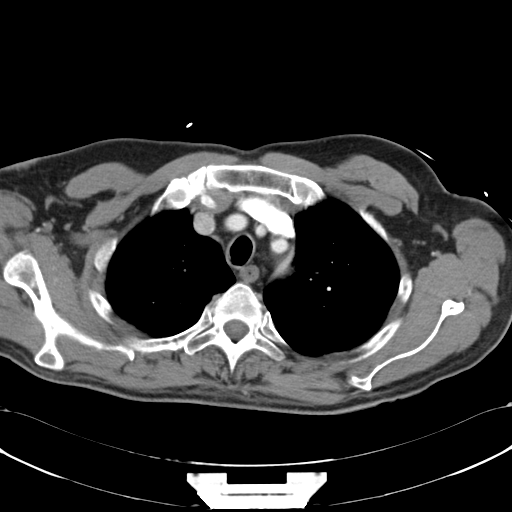
[im 56/65  soft-tissue]
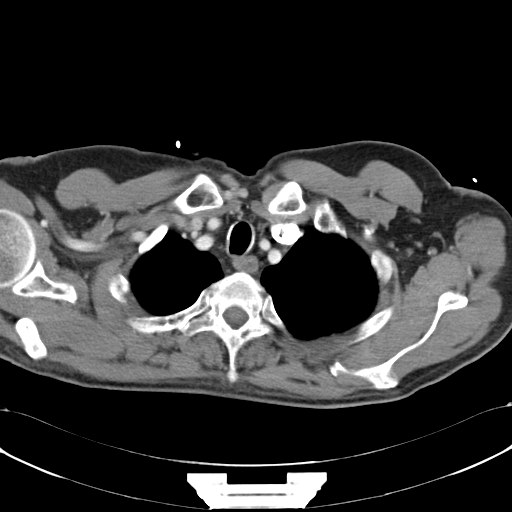
[im 60/65  soft-tissue]
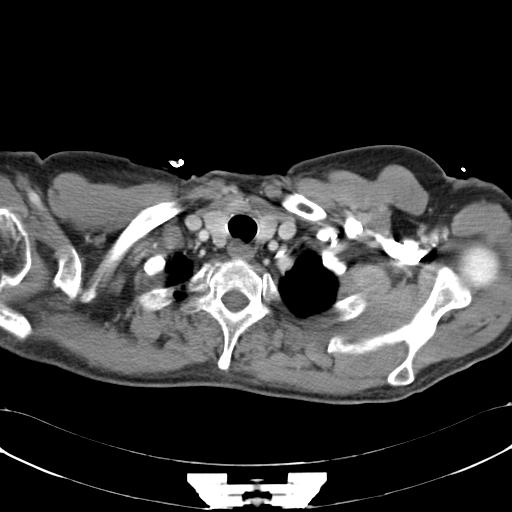

[Series 5: routine chest with cor · coronal · 0.67mm/px · 3 of 126 slices shown]
[im 42/126  soft-tissue]
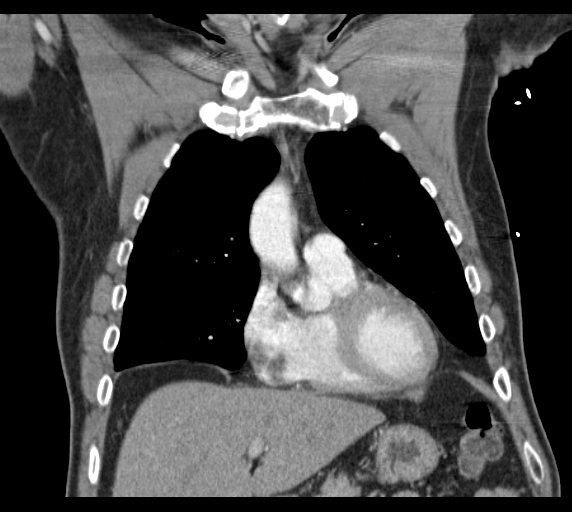
[im 56/126  soft-tissue]
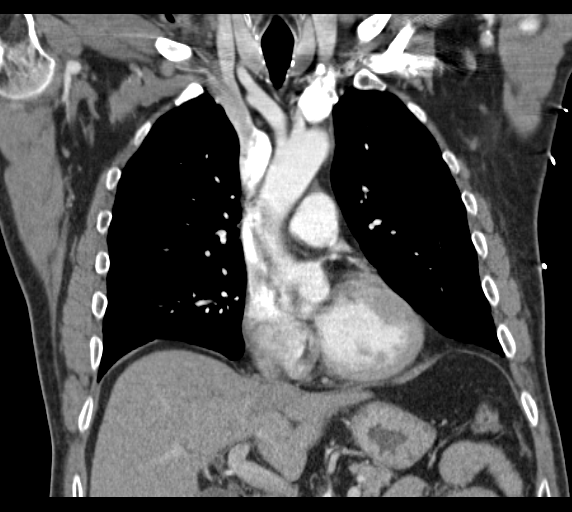
[im 70/126  soft-tissue]
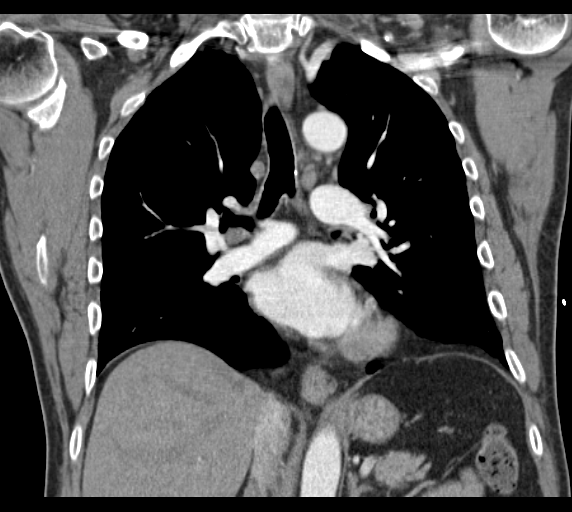

[17 of 46 positions shown; findings below may reference images not displayed]

FINDINGS: Minimal bibasilar dependent atelectatic changes of the lungs. The
lungs are otherwise clear. Mild centrilobular emphysema. There is no
pleural effusion or pneumothorax. The central airways are patent.

The thoracic aorta appears unremarkable. The origins of the great
vessels of the aortic arch appear patent. The central pulmonary
arteries appear unremarkable. There is no cardiomegaly or
pericardial effusion. No hilar or mediastinal adenopathy. The
esophagus is grossly unremarkable. No thyroid nodules identified.

There is no axillary adenopathy. The chest wall soft tissues appear
unremarkable. The osseous structures are intact.

There multiple stones within the visualized gallbladder. The
visualized upper abdomen is otherwise unremarkable.
IMPRESSION: No acute intrathoracic pathology.

Cholelithiasis.

## 2020-10-30 DEATH — deceased
